# Patient Record
Sex: Male | Born: 1960 | Race: Black or African American | Hispanic: No | Marital: Married | Smoking: Former smoker
Health system: Southern US, Community
[De-identification: ages and names within clinical notes are randomized; demographics above are authoritative.]

## PROBLEM LIST (undated history)

## (undated) DIAGNOSIS — E785 Hyperlipidemia, unspecified: Secondary | ICD-10-CM

## (undated) DIAGNOSIS — J45909 Unspecified asthma, uncomplicated: Secondary | ICD-10-CM

## (undated) DIAGNOSIS — E291 Testicular hypofunction: Secondary | ICD-10-CM

## (undated) DIAGNOSIS — I10 Essential (primary) hypertension: Secondary | ICD-10-CM

## (undated) HISTORY — PX: APPENDECTOMY: SHX54

---

## 2009-12-31 ENCOUNTER — Ambulatory Visit: Payer: Self-pay | Admitting: Internal Medicine

## 2010-07-05 ENCOUNTER — Ambulatory Visit: Payer: Self-pay | Admitting: Urology

## 2011-05-20 ENCOUNTER — Ambulatory Visit: Payer: Self-pay | Admitting: Internal Medicine

## 2012-02-03 IMAGING — CT CT ABD-PELV W/O CM
1 of 2 series · 15 of 32 positions shown, 19 images · non-contrast
Comparison: none

REASON FOR EXAM: gross hematuria
COMMENTS:

[Series 3: soft tissue · axial · 0.80mm/px · z∈[-252,+165]mm · 15 of 153 slices shown, 19 images]
[im 7/153  soft-tissue]
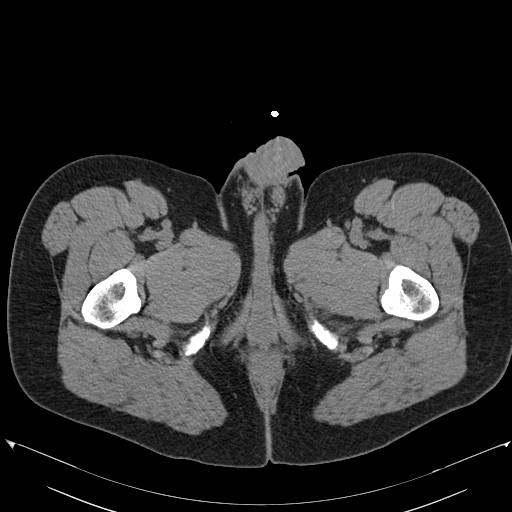
[im 7/153  bone]
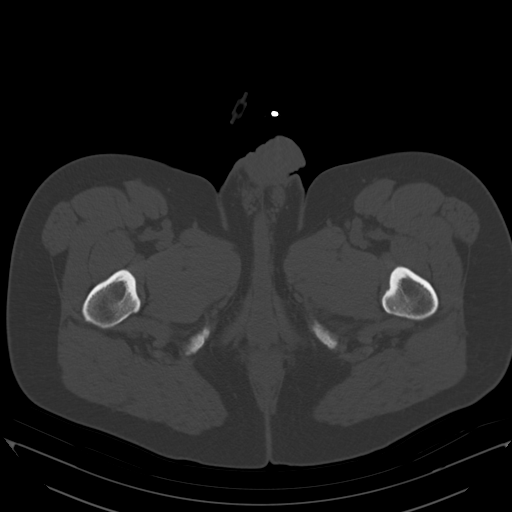
[im 19/153  soft-tissue]
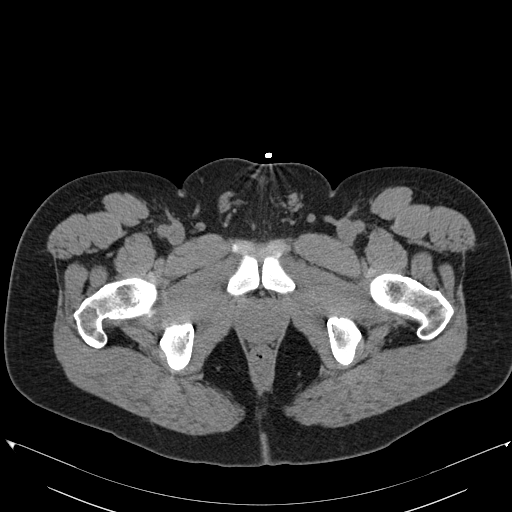
[im 31/153  soft-tissue]
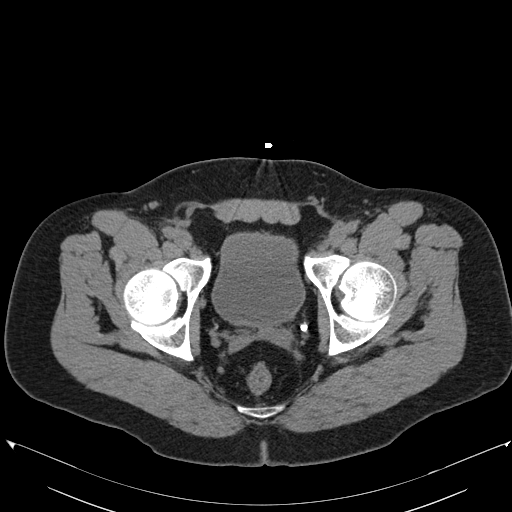
[im 43/153  soft-tissue]
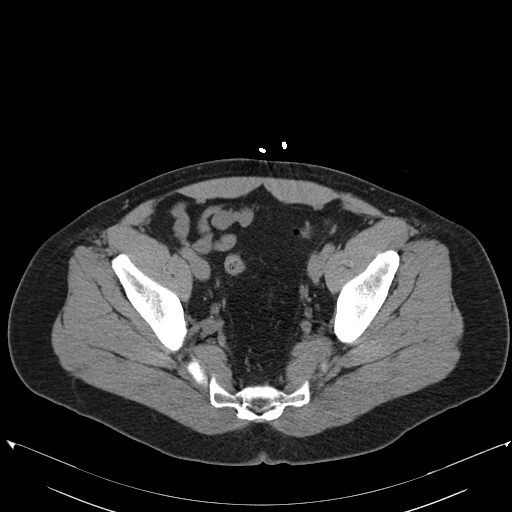
[im 55/153  soft-tissue]
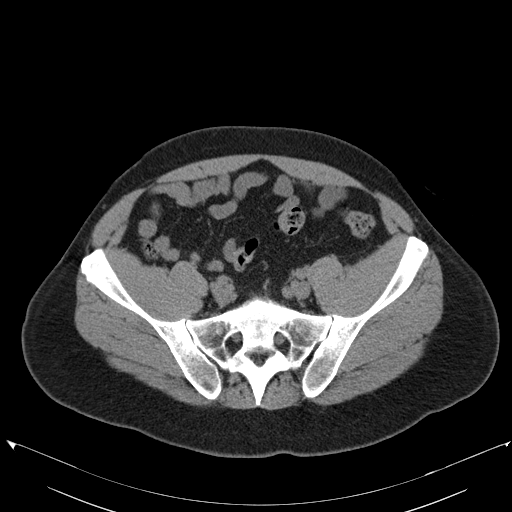
[im 67/153  soft-tissue]
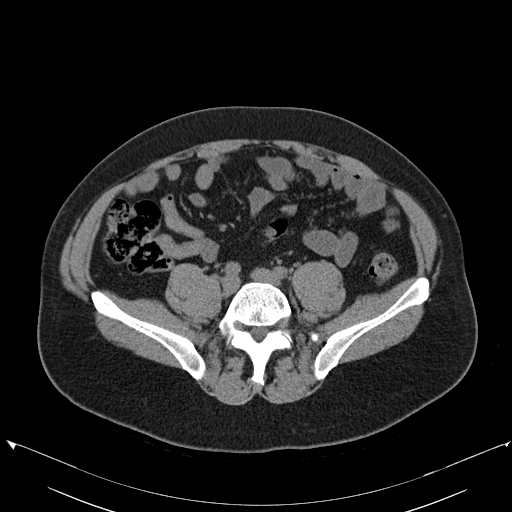
[im 80/153  soft-tissue]
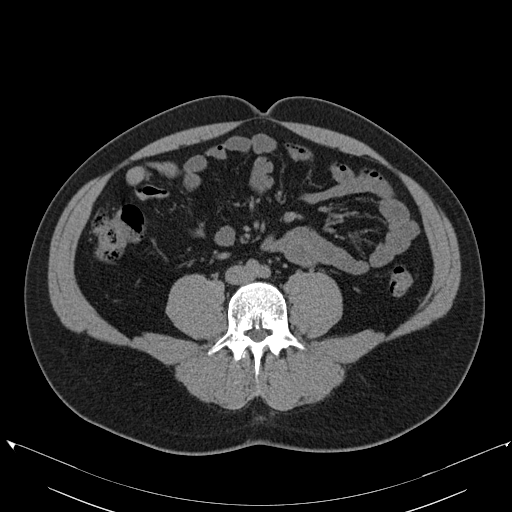
[im 86/153  soft-tissue]
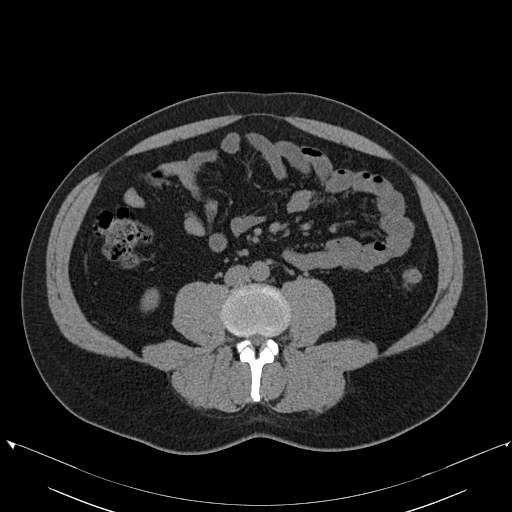
[im 98/153  soft-tissue]
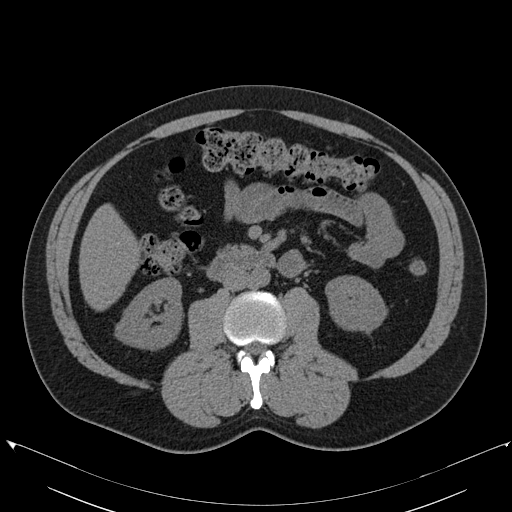
[im 98/153  bone]
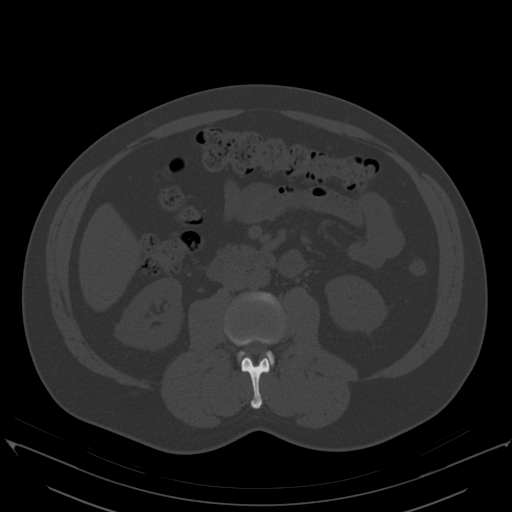
[im 110/153  soft-tissue]
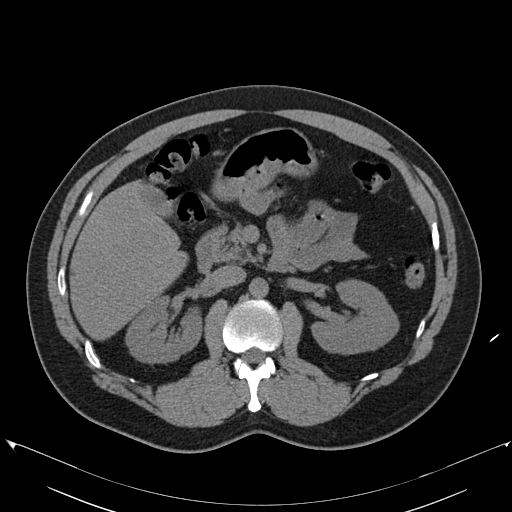
[im 122/153  soft-tissue]
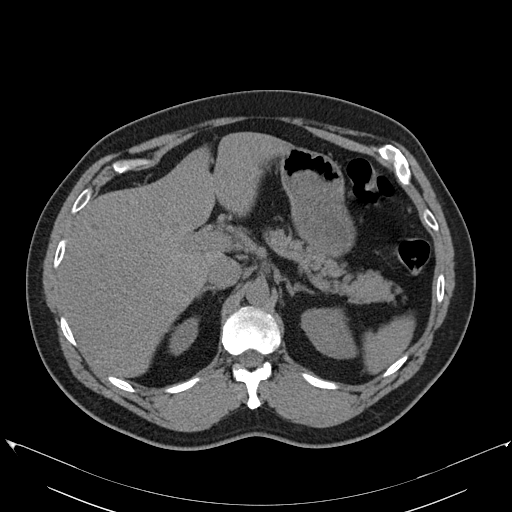
[im 128/153  lung]
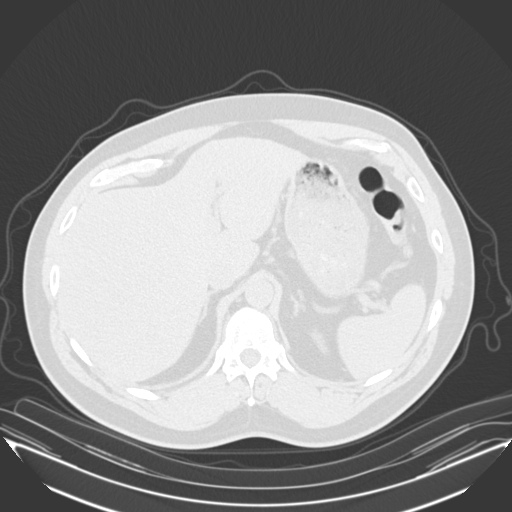
[im 134/153  soft-tissue]
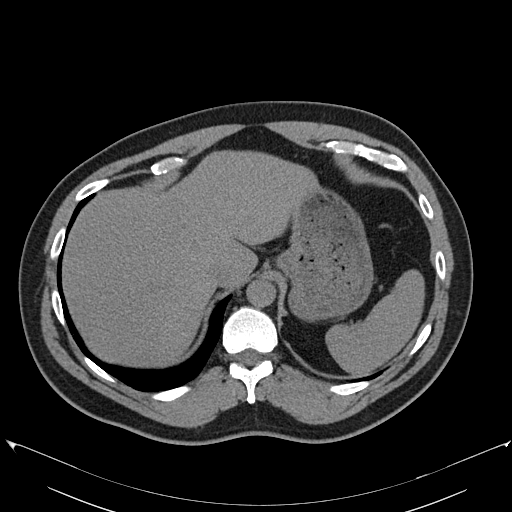
[im 134/153  lung]
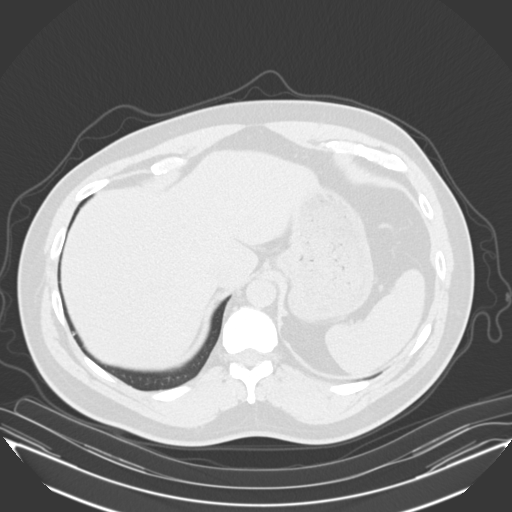
[im 140/153  lung]
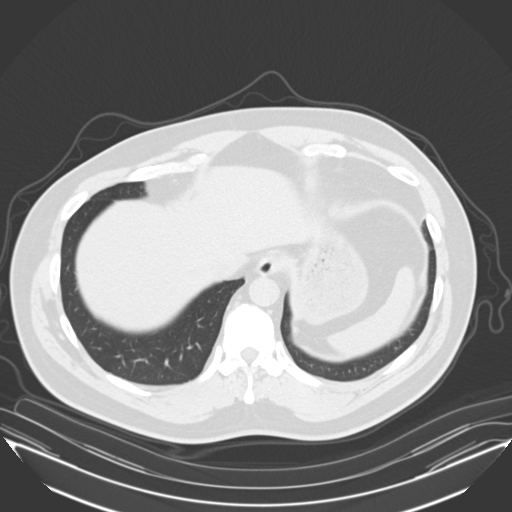
[im 146/153  soft-tissue]
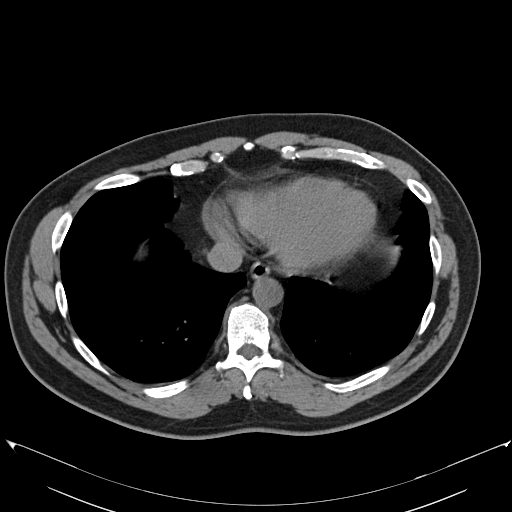
[im 146/153  lung]
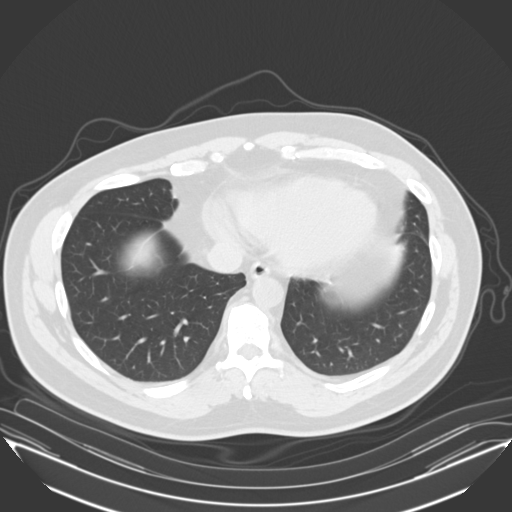

[15 of 32 positions shown; findings below may reference images not displayed]

PROCEDURE:     GESWINDT - GESWINDT ABDOMEN AND PELVIS WO  - July 05, 2010  [DATE]

RESULT:     Axial noncontrast CT scanning was performed through the abdomen
and pelvis at 3 mm intervals and slice thicknesses. Review of multiplanar
reconstructed images was performed separately on the VIA monitor.

The kidneys exhibit normal density and contour. I see no evidence of
calcified stones nor of obstruction. The perinephric fat is normal in
density. Along the expected course of the ureters I do not see suspicious
calcifications. There are phleboliths within the pelvis. The partially
distended urinary bladder is normal in appearance. The prostate gland and
seminal vesicles are normal in appearance.

The liver exhibits an approximately 8 x 10 mm diameter hypodensity near the
dome most compatible with a cyst. A similar but smaller finding is noted in
the lateral aspect of the right lobe on image 33. The gallbladder is
adequately distended with no evidence of calcified stones. The pancreas,
spleen, partially distended stomach, adrenal glands, and periaortic and
pericaval regions are normal in appearance. The unopacified loops of small
and large bowel exhibit no evidence of ileus or obstruction or acute
inflammation. I do not see evidence of ascites. There are small bilateral
inguinal hernias containing only fat. The lung bases are clear. The lumbar
vertebral bodies are preserved in height.
IMPRESSION: 1. I do not see intrinsic abnormality of the kidneys or ureters or urinary
bladder on this noncontrast study. The prostate gland and seminal vesicles
are grossly normal as well.
2. I do not see acute hepatobiliary abnormality nor acute bowel abnormality.
3. I see no intra-abdominal or pelvic lymphadenopathy.

## 2014-12-18 ENCOUNTER — Ambulatory Visit: Payer: 59 | Admitting: Anesthesiology

## 2014-12-18 ENCOUNTER — Ambulatory Visit
Admission: RE | Admit: 2014-12-18 | Discharge: 2014-12-18 | Disposition: A | Discharge status: Home / Self Care | Payer: 59 | Source: Ambulatory Visit | Attending: Gastroenterology | Admitting: Gastroenterology

## 2014-12-18 ENCOUNTER — Encounter
Admission: RE | Disposition: A | Discharge status: Home / Self Care | Payer: Self-pay | Source: Ambulatory Visit | Attending: Gastroenterology

## 2014-12-18 DIAGNOSIS — D122 Benign neoplasm of ascending colon: Secondary | ICD-10-CM | POA: Diagnosis not present

## 2014-12-18 DIAGNOSIS — I10 Essential (primary) hypertension: Secondary | ICD-10-CM | POA: Diagnosis not present

## 2014-12-18 DIAGNOSIS — D124 Benign neoplasm of descending colon: Secondary | ICD-10-CM | POA: Insufficient documentation

## 2014-12-18 DIAGNOSIS — Z1211 Encounter for screening for malignant neoplasm of colon: Secondary | ICD-10-CM | POA: Diagnosis present

## 2014-12-18 DIAGNOSIS — J45909 Unspecified asthma, uncomplicated: Secondary | ICD-10-CM | POA: Insufficient documentation

## 2014-12-18 DIAGNOSIS — Z87891 Personal history of nicotine dependence: Secondary | ICD-10-CM | POA: Insufficient documentation

## 2014-12-18 DIAGNOSIS — E785 Hyperlipidemia, unspecified: Secondary | ICD-10-CM | POA: Insufficient documentation

## 2014-12-18 DIAGNOSIS — Z86010 Personal history of colonic polyps: Secondary | ICD-10-CM | POA: Insufficient documentation

## 2014-12-18 HISTORY — PX: COLONOSCOPY WITH PROPOFOL: SHX5780

## 2014-12-18 HISTORY — DX: Essential (primary) hypertension: I10

## 2014-12-18 HISTORY — DX: Testicular hypofunction: E29.1

## 2014-12-18 HISTORY — DX: Hyperlipidemia, unspecified: E78.5

## 2014-12-18 HISTORY — DX: Unspecified asthma, uncomplicated: J45.909

## 2014-12-18 SURGERY — COLONOSCOPY WITH PROPOFOL
Anesthesia: General

## 2014-12-18 MED ORDER — LIDOCAINE HCL (CARDIAC) 20 MG/ML IV SOLN
INTRAVENOUS | Status: DC | PRN
  Administered 2014-12-18: 50 mg via INTRAVENOUS

## 2014-12-18 MED ORDER — SODIUM CHLORIDE 0.9 % IV SOLN
INTRAVENOUS | Status: DC
Start: 2014-12-18 — End: 2014-12-18
  Administered 2014-12-18: 10:00:00 via INTRAVENOUS
  Administered 2014-12-18: 1000 mL via INTRAVENOUS

## 2014-12-18 MED ORDER — PROPOFOL 10 MG/ML IV BOLUS
INTRAVENOUS | Status: DC | PRN
  Administered 2014-12-18: 100 mg via INTRAVENOUS

## 2014-12-18 MED ORDER — PROPOFOL INFUSION 10 MG/ML OPTIME
INTRAVENOUS | Status: DC | PRN
Start: 2014-12-18 — End: 2014-12-18
  Administered 2014-12-18: 120 ug/kg/min via INTRAVENOUS

## 2014-12-18 NOTE — Anesthesia Preprocedure Evaluation (Signed)
Anesthesia Evaluation  Patient identified by MRN, date of birth, ID band Patient awake    Reviewed: Allergy & Precautions, H&P , NPO status , Patient's Chart, lab work & pertinent test results, reviewed documented beta blocker date and time   Airway Mallampati: II  TM Distance: >3 FB Neck ROM: full    Dental no notable dental hx. (+) Teeth Intact   Pulmonary asthma , former smoker,  breath sounds clear to auscultation  Pulmonary exam normal       Cardiovascular Exercise Tolerance: Good hypertension, Normal cardiovascular examRhythm:regular Rate:Normal     Neuro/Psych negative neurological ROS  negative psych ROS   GI/Hepatic negative GI ROS, Neg liver ROS,   Endo/Other  negative endocrine ROS  Renal/GU negative Renal ROS  negative genitourinary   Musculoskeletal   Abdominal   Peds  Hematology negative hematology ROS (+)   Anesthesia Other Findings Past Medical History:   Hypertension                                                 Asthma                                                       Hyperlipemia                                                 Hypogonadism male                                            Signs and symptoms suggestive of sleep apnea    Reproductive/Obstetrics negative OB ROS                             Anesthesia Physical Anesthesia Plan  ASA: III  Anesthesia Plan: General   Post-op Pain Management:    Induction:   Airway Management Planned:   Additional Equipment:   Intra-op Plan:   Post-operative Plan:   Informed Consent: I have reviewed the patients History and Physical, chart, labs and discussed the procedure including the risks, benefits and alternatives for the proposed anesthesia with the patient or authorized representative who has indicated his/her understanding and acceptance.   Dental Advisory Given  Plan Discussed with:  Anesthesiologist, CRNA and Surgeon  Anesthesia Plan Comments:         Anesthesia Quick Evaluation

## 2014-12-18 NOTE — H&P (Signed)
    Primary Care Physician:  Ezequiel Kayser, MD Primary Gastroenterologist:  Dr. Candace Cruise  Pre-Procedure History & Physical: HPI:  Leonard Cherry is a 54 y.o. male is here for an colonoscopy.   Past Medical History  Diagnosis Date  . Hypertension   . Asthma   . Hyperlipemia   . Hypogonadism male     Past Surgical History  Procedure Laterality Date  . Appendectomy      Prior to Admission medications   Medication Sig Start Date End Date Taking? Authorizing Provider  amLODipine (NORVASC) 10 MG tablet Take 10 mg by mouth daily.   Yes Historical Provider, MD  metoprolol succinate (TOPROL-XL) 100 MG 24 hr tablet Take 100 mg by mouth daily. Take with or immediately following a meal.   Yes Historical Provider, MD  Multiple Vitamin (MULTIVITAMIN) capsule Take 1 capsule by mouth daily.   Yes Historical Provider, MD    Allergies as of 11/18/2014  . (Not on File)    History reviewed. No pertinent family history.  History   Social History  . Marital Status: Married    Spouse Name: N/A  . Number of Children: N/A  . Years of Education: N/A   Occupational History  . Not on file.   Social History Main Topics  . Smoking status: Former Research scientist (life sciences)  . Smokeless tobacco: Not on file  . Alcohol Use: Not on file  . Drug Use: Not on file  . Sexual Activity: Not on file   Other Topics Concern  . Not on file   Social History Narrative  . No narrative on file    Review of Systems: See HPI, otherwise negative ROS  Physical Exam: There were no vitals taken for this visit. General:   Alert,  pleasant and cooperative in NAD Head:  Normocephalic and atraumatic. Neck:  Supple; no masses or thyromegaly. Lungs:  Clear throughout to auscultation.    Heart:  Regular rate and rhythm. Abdomen:  Soft, nontender and nondistended. Normal bowel sounds, without guarding, and without rebound.   Neurologic:  Alert and  oriented x4;  grossly normal neurologically.  Impression/Plan: Leonard Cherry  is here for an colonoscopy to be performed for screening.  Risks, benefits, limitations, and alternatives regarding colonoscopy have been reviewed with the patient.  Questions have been answered.  All parties agreeable.   Shameeka Silliman, Lupita Dawn, MD  12/18/2014, 9:23 AM

## 2014-12-18 NOTE — Transfer of Care (Signed)
Immediate Anesthesia Transfer of Care Note  Patient: Leonard Cherry  Procedure(s) Performed: Procedure(s): COLONOSCOPY WITH PROPOFOL (N/A)  Patient Location: Endoscopy Unit  Anesthesia Type:General  Level of Consciousness: awake  Airway & Oxygen Therapy: Patient Spontanous Breathing and Patient connected to nasal cannula oxygen  Post-op Assessment: Report given to RN  Post vital signs: Reviewed  Last Vitals:  Filed Vitals:   12/18/14 1023  BP: 107/79  Pulse: 64  Temp: 36.4 C  Resp: 14    Complications: No apparent anesthesia complications

## 2014-12-18 NOTE — Anesthesia Postprocedure Evaluation (Signed)
  Anesthesia Post-op Note  Patient: Leonard Cherry  Procedure(s) Performed: Procedure(s): COLONOSCOPY WITH PROPOFOL (N/A)  Anesthesia type:General  Patient location: PACU  Post pain: Pain level controlled  Post assessment: Post-op Vital signs reviewed, Patient's Cardiovascular Status Stable, Respiratory Function Stable, Patent Airway and No signs of Nausea or vomiting  Post vital signs: Reviewed and stable  Last Vitals:  Filed Vitals:   12/18/14 1050  BP: 123/91  Pulse: 55  Temp:   Resp: 20    Level of consciousness: awake, alert  and patient cooperative  Complications: No apparent anesthesia complications

## 2014-12-18 NOTE — Op Note (Signed)
[facility] Patient Name: Leonard Cherry Procedure Date: 12/18/2014 10:01 AM MRN: [id number] Account #: [id number] Date of Birth: 07/25/1960 Admit Type: Outpatient Age: 54 Room: [facility] ENDO ROOM 4 Gender: Male Note Status: Finalized Procedure:         Colonoscopy Indications:       Screening for colorectal malignant neoplasm Providers:         Lupita Dawn. Candace Cruise, MD Referring MD:      Christena Flake. Raechel Ache, MD (Referring MD) Medicines:         Monitored Anesthesia Care Complications:     No immediate complications. Procedure:         Pre-Anesthesia Assessment:                    - Prior to the procedure, a History and Physical was                     performed, and patient medications, allergies and                     sensitivities were reviewed. The patient's tolerance of                     previous anesthesia was reviewed.                    - The risks and benefits of the procedure and the sedation                     options and risks were discussed with the patient. All                     questions were answered and informed consent was obtained.                    - After reviewing the risks and benefits, the patient was                     deemed in satisfactory condition to undergo the procedure.                    After obtaining informed consent, the colonoscope was                     passed under direct vision. Throughout the procedure, the                     patient's blood pressure, pulse, and oxygen saturations                     were monitored continuously. The Colonoscope was                     introduced through the anus and advanced to the the cecum,                     identified by appendiceal orifice and ileocecal valve. The                     colonoscopy was performed without difficulty. The patient                     tolerated the procedure well. The quality of the bowel  preparation was fair. Findings:      A  small polyp was found in the distal ascending colon. The polyp was       sessile. The polyp was removed with a cold snare. Resection and       retrieval were complete.      A diminutive polyp was found in the descending colon. The polyp was       sessile. The polyp was removed with a jumbo cold forceps. Resection and       retrieval were complete.      The exam was otherwise without abnormality. Impression:        - One small polyp in the distal ascending colon. Resected                     and retrieved.                    - One diminutive polyp in the descending colon. Resected                     and retrieved.                    - The examination was otherwise normal. Recommendation:    - Discharge patient to home.                    - Await pathology results.                    - Repeat colonoscopy in 3 - 5 years for surveillance based                     on pathology results.                    - The findings and recommendations were discussed with the                     patient. Procedure Code(s): --- Professional ---                    [phone], Colonoscopy, flexible; with removal of tumor(s),                     polyp(s), or other lesion(s) by snare technique                    [id number], 57, Colonoscopy, flexible; with biopsy, single or                     multiple Diagnosis Code(s): --- Professional ---                    Z12.11, Encounter for screening for malignant neoplasm of                     colon                    D12.2, Benign neoplasm of ascending colon                    D12.4, Benign neoplasm of descending colon CPT copyright 2014 American Medical Association. All rights reserved. The codes documented in this report are preliminary and upon coder review may  be revised to meet current compliance requirements. Hulen Luster, MD 12/18/2014 10:19:56 AM This  report has been signed electronically. Number of Addenda: 0 Note Initiated On: 12/18/2014 10:01 AM Scope Withdrawal  Time: 0 hours 8 minutes 33 seconds  Total Procedure Duration: 0 hours 10 minutes 33 seconds       [facility]

## 2014-12-21 ENCOUNTER — Encounter: Payer: Self-pay | Admitting: Gastroenterology

## 2014-12-21 LAB — SURGICAL PATHOLOGY

## 2015-10-27 DIAGNOSIS — Z862 Personal history of diseases of the blood and blood-forming organs and certain disorders involving the immune mechanism: Secondary | ICD-10-CM | POA: Insufficient documentation

## 2016-05-01 DIAGNOSIS — J302 Other seasonal allergic rhinitis: Secondary | ICD-10-CM | POA: Insufficient documentation

## 2021-02-04 DIAGNOSIS — R7303 Prediabetes: Secondary | ICD-10-CM | POA: Insufficient documentation

## 2022-04-24 DIAGNOSIS — E66811 Obesity, class 1: Secondary | ICD-10-CM | POA: Insufficient documentation

## 2022-04-24 DIAGNOSIS — E669 Obesity, unspecified: Secondary | ICD-10-CM | POA: Insufficient documentation

## 2022-12-30 ENCOUNTER — Ambulatory Visit
Admission: EM | Admit: 2022-12-30 | Discharge: 2022-12-30 | Disposition: A | Discharge status: Home / Self Care | Payer: BC Managed Care – PPO

## 2022-12-30 DIAGNOSIS — E785 Hyperlipidemia, unspecified: Secondary | ICD-10-CM | POA: Insufficient documentation

## 2022-12-30 DIAGNOSIS — E291 Testicular hypofunction: Secondary | ICD-10-CM | POA: Insufficient documentation

## 2022-12-30 DIAGNOSIS — H938X2 Other specified disorders of left ear: Secondary | ICD-10-CM

## 2022-12-30 DIAGNOSIS — I10 Essential (primary) hypertension: Secondary | ICD-10-CM | POA: Insufficient documentation

## 2022-12-30 DIAGNOSIS — Z8679 Personal history of other diseases of the circulatory system: Secondary | ICD-10-CM

## 2022-12-30 NOTE — ED Provider Notes (Signed)
[facility]    CSN: [id number] Arrival date & time: 12/30/22  1202      History   Chief Complaint Chief Complaint  Patient presents with   Ear Fullness    HPI Leonard Cherry is a 62 y.o. male.   Leonard Cherry, 62 year old male pt, presents to urgent care for left ear fullness x 2 weeks.  Patient denies any known injury or trauma no recent swimming, patient does use Q-tips  The history is provided by the patient. No language interpreter was used.    Past Medical History:  Diagnosis Date   Asthma    Hyperlipemia    Hypertension    Hypogonadism male     Patient Active Problem List   Diagnosis Date Noted   Benign essential hypertension 12/30/2022   Hyperlipidemia 12/30/2022   Hypogonadism male 12/30/2022   Sensation of fullness in left ear 12/30/2022   History of hypertension 12/30/2022   Obesity (BMI 30.0-34.9) 04/24/2022   Prediabetes 02/04/2021   Seasonal allergic rhinitis 05/01/2016   History of thrombocytopenia 10/27/2015   History of adenomatous polyp of colon 12/18/2014    Past Surgical History:  Procedure Laterality Date   APPENDECTOMY     COLONOSCOPY WITH PROPOFOL N/A 12/18/2014   Procedure: COLONOSCOPY WITH PROPOFOL;  Surgeon: Wallace Cullens, MD;  Location: [facility] ENDOSCOPY;  Service: Gastroenterology;  Laterality: N/A;       Home Medications    Prior to Admission medications   Medication Sig Start Date End Date Taking? Authorizing Provider  amLODipine (NORVASC) 10 MG tablet Take 10 mg by mouth daily.   Yes [provider]  metoprolol succinate (TOPROL-XL) 100 MG 24 hr tablet Take 100 mg by mouth daily. Take with or immediately following a meal.   Yes [provider]  Multiple Vitamin (MULTIVITAMIN) capsule Take 1 capsule by mouth daily.   Yes [provider]    Family History History reviewed. No pertinent family history.  Social History Social History   Tobacco Use   Smoking status: Former      Allergies   Patient has no known allergies.   Review of Systems Review of Systems  HENT:         Ear fullness  All other systems reviewed and are negative.    Physical Exam Triage Vital Signs ED Triage Vitals  Encounter Vitals Group     BP      Systolic BP Percentile      Diastolic BP Percentile      Pulse      Resp      Temp      Temp src      SpO2      Weight      Height      Head Circumference      Peak Flow      Pain Score      Pain Loc      Pain Education      Exclude from Growth Chart    No data found.  Updated Vital Signs BP (!) 150/87 (BP Location: Left Arm)   Pulse 65   Temp 98.1 F (36.7 C) (Oral)   Resp 16   Ht 6' (1.829 m)   Wt 225 lb (102.1 kg)   SpO2 98%   BMI 30.52 kg/m   Visual Acuity Right Eye Distance:   Left Eye Distance:   Bilateral Distance:    Right Eye Near:   Left Eye Near:  Bilateral Near:     Physical Exam Vitals and nursing note reviewed.  HENT:     Head: Normocephalic.     Right Ear: Tympanic membrane is retracted.     Left Ear: Tympanic membrane is retracted.     Ears:     Comments: Dark cerumen against lower TM left Cardiovascular:     Rate and Rhythm: Normal rate and regular rhythm.     Pulses: Normal pulses.     Heart sounds: Normal heart sounds.  Pulmonary:     Effort: Pulmonary effort is normal.     Breath sounds: Normal breath sounds and air entry.  Neurological:     General: No focal deficit present.     Mental Status: He is alert and oriented to person, place, and time.     GCS: GCS eye subscore is 4. GCS verbal subscore is 5. GCS motor subscore is 6.  Psychiatric:        Attention and Perception: Attention normal.        Mood and Affect: Mood normal.        Speech: Speech normal.        Behavior: Behavior normal.      UC Treatments / Results  Labs (all labs ordered are listed, but only abnormal results are displayed) Labs Reviewed - No data to display  EKG   Radiology No  results found.  Procedures Procedures (including critical care time)  Medications Ordered in UC Medications - No data to display  Initial Impression / Assessment and Plan / UC Course  I have reviewed the triage vital signs and the nursing notes.  Pertinent labs & imaging results that were available during my care of the patient were reviewed by me and considered in my medical decision making (see chart for details).     Ddx: Left ear fullness, allergies, OM Final Clinical Impressions(s) / UC Diagnoses   Final diagnoses:  Sensation of fullness in left ear  History of hypertension     Discharge Instructions      Continue your Flonase, may take over-the-counter allergy medicine of choice(Zyrtec Claritin Allegra, avoid the ones with decongestant as it may make your blood pressure go up.  Follow-up with Dr. Alvino Chapel if your symptoms persist, do not stick anything in your ears.      ED Prescriptions   None    PDMP not reviewed this encounter.   Clancy Gourd, NP 12/30/22 1258

## 2022-12-30 NOTE — ED Triage Notes (Signed)
Pt c/o L ear fullness x2 wks. Denies any drainage or pain. Has tried cleaning it out w/q-tip w/o relief.

## 2022-12-30 NOTE — Discharge Instructions (Addendum)
Continue your Flonase, may take over-the-counter allergy medicine of choice(Zyrtec Claritin Allegra, avoid the ones with decongestant as it may make your blood pressure go up.  Follow-up with Dr. Alvino Chapel if your symptoms persist, do not stick anything in your ears.

## 2023-05-03 ENCOUNTER — Ambulatory Visit: Payer: BC Managed Care – PPO

## 2023-05-03 DIAGNOSIS — D123 Benign neoplasm of transverse colon: Secondary | ICD-10-CM | POA: Diagnosis not present

## 2023-05-03 DIAGNOSIS — Z09 Encounter for follow-up examination after completed treatment for conditions other than malignant neoplasm: Secondary | ICD-10-CM | POA: Diagnosis present

## 2023-05-03 DIAGNOSIS — Z860101 Personal history of adenomatous and serrated colon polyps: Secondary | ICD-10-CM | POA: Diagnosis not present

## 2023-05-03 DIAGNOSIS — D122 Benign neoplasm of ascending colon: Secondary | ICD-10-CM | POA: Diagnosis not present

## 2023-05-03 DIAGNOSIS — K640 First degree hemorrhoids: Secondary | ICD-10-CM | POA: Diagnosis not present
